# Patient Record
Sex: Male | Born: 1985 | Race: Black or African American | Hispanic: No | Marital: Single | Smoking: Current every day smoker
Health system: Southern US, Community
[De-identification: ages and names within clinical notes are randomized; demographics above are authoritative.]

## PROBLEM LIST (undated history)

## (undated) DIAGNOSIS — B20 Human immunodeficiency virus [HIV] disease: Secondary | ICD-10-CM

---

## 2013-03-02 ENCOUNTER — Emergency Department (HOSPITAL_COMMUNITY): Payer: No Typology Code available for payment source

## 2013-03-02 ENCOUNTER — Emergency Department (HOSPITAL_COMMUNITY)
Admission: EM | Admit: 2013-03-02 | Discharge: 2013-03-02 | Disposition: A | Discharge status: Home / Self Care | Payer: No Typology Code available for payment source | Attending: Emergency Medicine | Admitting: Emergency Medicine

## 2013-03-02 ENCOUNTER — Encounter (HOSPITAL_COMMUNITY): Payer: Self-pay | Admitting: *Deleted

## 2013-03-02 DIAGNOSIS — IMO0002 Reserved for concepts with insufficient information to code with codable children: Secondary | ICD-10-CM | POA: Insufficient documentation

## 2013-03-02 DIAGNOSIS — Y9389 Activity, other specified: Secondary | ICD-10-CM | POA: Insufficient documentation

## 2013-03-02 DIAGNOSIS — S50812A Abrasion of left forearm, initial encounter: Secondary | ICD-10-CM

## 2013-03-02 DIAGNOSIS — Z21 Asymptomatic human immunodeficiency virus [HIV] infection status: Secondary | ICD-10-CM | POA: Insufficient documentation

## 2013-03-02 DIAGNOSIS — Y92410 Unspecified street and highway as the place of occurrence of the external cause: Secondary | ICD-10-CM | POA: Insufficient documentation

## 2013-03-02 DIAGNOSIS — S60222A Contusion of left hand, initial encounter: Secondary | ICD-10-CM

## 2013-03-02 DIAGNOSIS — S60229A Contusion of unspecified hand, initial encounter: Secondary | ICD-10-CM | POA: Insufficient documentation

## 2013-03-02 HISTORY — DX: Human immunodeficiency virus (HIV) disease: B20

## 2013-03-02 MED ORDER — LIDOCAINE-EPINEPHRINE-TETRACAINE (LET) SOLUTION
3.0000 mL | Freq: Once | NASAL | Status: AC
  Administered 2013-03-02: 3 mL via TOPICAL
  Filled 2013-03-02 (×2): qty 3

## 2013-03-02 MED ORDER — CYCLOBENZAPRINE HCL 10 MG PO TABS: 10.0000 mg | ORAL_TABLET | Freq: Two times a day (BID) | ORAL | Status: AC | PRN

## 2013-03-02 MED ORDER — IBUPROFEN 800 MG PO TABS: 800.0000 mg | ORAL_TABLET | Freq: Three times a day (TID) | ORAL | Status: AC
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
## 2013-03-03 NOTE — ED Provider Notes (Signed)
Medical screening examination/treatment/procedure(s) were performed by non-physician practitioner and as supervising physician I was immediately available for consultation/collaboration.    Danarius Mcconathy R Marri Mcneff, MD 03/03/13 1509 

## 2013-04-06 ENCOUNTER — Ambulatory Visit: Payer: Self-pay
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
## 2018-03-12 ENCOUNTER — Emergency Department (HOSPITAL_BASED_OUTPATIENT_CLINIC_OR_DEPARTMENT_OTHER)
Admission: EM | Admit: 2018-03-12 | Discharge: 2018-03-12 | Disposition: A | Discharge status: Home / Self Care | Payer: Self-pay | Attending: Emergency Medicine | Admitting: Emergency Medicine

## 2018-03-12 ENCOUNTER — Other Ambulatory Visit: Payer: Self-pay

## 2018-03-12 ENCOUNTER — Encounter (HOSPITAL_BASED_OUTPATIENT_CLINIC_OR_DEPARTMENT_OTHER): Payer: Self-pay | Admitting: Emergency Medicine

## 2018-03-12 DIAGNOSIS — F17200 Nicotine dependence, unspecified, uncomplicated: Secondary | ICD-10-CM | POA: Insufficient documentation

## 2018-03-12 DIAGNOSIS — B20 Human immunodeficiency virus [HIV] disease: Secondary | ICD-10-CM | POA: Insufficient documentation

## 2018-03-12 DIAGNOSIS — B070 Plantar wart: Secondary | ICD-10-CM | POA: Insufficient documentation

## 2018-03-12 LAB — CBC WITH DIFFERENTIAL/PLATELET
BASOS PCT: 1 %
Basophils Absolute: 0.1 10*3/uL (ref 0.0–0.1)
EOS PCT: 7 %
Eosinophils Absolute: 0.4 10*3/uL (ref 0.0–0.7)
HEMATOCRIT: 41.8 % (ref 39.0–52.0)
HEMOGLOBIN: 14.6 g/dL (ref 13.0–17.0)
Lymphocytes Relative: 27 %
Lymphs Abs: 1.5 10*3/uL (ref 0.7–4.0)
MCH: 31.1 pg (ref 26.0–34.0)
MCHC: 34.9 g/dL (ref 30.0–36.0)
MCV: 89.1 fL (ref 78.0–100.0)
MONOS PCT: 11 %
Monocytes Absolute: 0.6 10*3/uL (ref 0.1–1.0)
NEUTROS PCT: 54 %
Neutro Abs: 2.9 10*3/uL (ref 1.7–7.7)
Platelets: 75 10*3/uL — ABNORMAL LOW (ref 150–400)
RBC: 4.69 MIL/uL (ref 4.22–5.81)
RDW: 13.5 % (ref 11.5–15.5)
SMEAR REVIEW: DECREASED
WBC: 5.5 10*3/uL (ref 4.0–10.5)

## 2018-03-12 LAB — BASIC METABOLIC PANEL
ANION GAP: 8 (ref 5–15)
BUN: 9 mg/dL (ref 6–20)
CHLORIDE: 107 mmol/L (ref 98–111)
CO2: 25 mmol/L (ref 22–32)
Calcium: 8.8 mg/dL — ABNORMAL LOW (ref 8.9–10.3)
Creatinine, Ser: 1.13 mg/dL (ref 0.61–1.24)
GFR calc Af Amer: >60 mL/min (ref >60)
GLUCOSE: 88 mg/dL (ref 70–99)
POTASSIUM: 4.3 mmol/L (ref 3.5–5.1)
Sodium: 140 mmol/L (ref 135–145)

## 2018-03-12 MED ORDER — LIDOCAINE-EPINEPHRINE (PF) 2 %-1:200000 IJ SOLN
10.0000 mL | Freq: Once | INTRAMUSCULAR | Status: AC
  Administered 2018-03-12: 10 mL
  Filled 2018-03-12 (×2): qty 10

## 2018-03-12 NOTE — Discharge Instructions (Addendum)
White count is normal today.  Given risk that we discussed, we did not remove the wart.  Unfortunately, the pain will persist until you are treated by podiatry.  You can call Dr. Logan BoresEvans office tomorrow and see if he can see you sooner for reevaluation.  I have sent him a message to attempt to facilitate close follow up.   Stop cutting or poking the wart as this puts you at risk for a superimposed infection.  Keep the area clean, wash with clean water and soap after sweating or if it gets dirty.  For pain can take 1000 mg of acetaminophen and/or 600 mg of ibuprofen every 6-8 hours.  Return to the ER for worsening swelling, warmth, pus, fevers, chills

## 2018-03-12 NOTE — ED Triage Notes (Signed)
Pt states he has a plantars wart on the bottom of his left foot  Pt states he has been seen at [facility] and [facility]  Pt states he has been trying to remove it at home but it is painful and has some pus

## 2018-03-12 NOTE — ED Provider Notes (Signed)
[facility] EMERGENCY DEPARTMENT Provider Note   CSN: [id number] Arrival date & time: 03/12/18  2042     History   Chief Complaint Chief Complaint  Patient presents with  . Foot Pain    HPI Douglas Mcconnell is a 32 y.o. male with history of HIV on Bactrim prophylaxis, compliant on HIV medications, is here for evaluation of worsening pain associated with plantar wart to the bottom of his left foot.  Onset 2 months ago.  Pain is severe, gradually worsening.  Patient states he has been to multiple ERs for this.  Associate symptoms include local swelling and bruising, clear/yellow discharge and foot swelling.  He has tried duct tape, over-the-counter freezing wart liquid.  His friend has been taking tweezers and has tried to take the wart out.  Pain is worse with any palpation, putting weight on it.  Has taken over-the-counter NSAIDs for pain without significant relief.  He has scheduled a [facility] appointment at [facility] in [facility] month.  He denies fevers, chills.  HPI  Past Medical History:  Diagnosis Date  . HIV (human immunodeficiency virus infection) (HCC)     There are no active problems to display for this patient.   History reviewed. No pertinent surgical history.      Home Medications    Prior to Admission medications   Medication Sig Start Date End Date Taking? Authorizing Provider  cyclobenzaprine (FLEXERIL) 10 MG tablet Take 1 tablet (10 mg total) by mouth 2 (two) times daily as needed for muscle spasms. 03/02/13   Kirichenko, Lemont Fillers, PA-C  ibuprofen (ADVIL,MOTRIN) 800 MG tablet Take 1 tablet (800 mg total) by mouth 3 (three) times daily. 03/02/13   Jaynie Crumble, PA-C    Family History No family history on file.  Social History Social History   Tobacco Use  . Smoking status: Current Every Day Smoker  . Smokeless tobacco: Never Used  Substance Use Topics  . Alcohol use: Yes  . Drug use: Yes    Types: Marijuana     Allergies   Patient has no known  allergies.   Review of Systems Review of Systems  Skin:       Plantar wart, drainage, swelling  Allergic/Immunologic: Positive for immunocompromised state.  All other systems reviewed and are negative.    Physical Exam Updated Vital Signs BP (!) 132/94 (BP Location: Left Arm)   Pulse 88   Temp 98.2 F (36.8 C) (Oral)   Resp 20   Ht 6\' 2"  (1.88 m)   Wt 79.4 kg   SpO2 96%   BMI 22.47 kg/m   Physical Exam  Constitutional: He is oriented to person, place, and time. He appears well-developed and well-nourished.  Non-toxic appearance.  HENT:  Head: Normocephalic.  Right Ear: External ear normal.  Left Ear: External ear normal.  Nose: Nose normal.  Eyes: Conjunctivae and EOM are normal.  Neck: Full passive range of motion without pain.  Cardiovascular: Normal rate.  Pulmonary/Chest: Effort normal. No tachypnea. No respiratory distress.  Musculoskeletal: Normal range of motion.  Neurological: He is alert and oriented to person, place, and time.  Skin: Skin is warm and dry. Capillary refill takes less than 2 seconds.  1 cm plantar wart to the bottom of the left foot mild skin hyperpigmentation circumferentially, tenderness and scant amount of serous-like/clear yellow fluid with pressure.  No fluctuance around this. No obvious foot edema, redness, warmth.  Psychiatric: His behavior is normal. Thought content normal.     ED Treatments /  Results  Labs (all labs ordered are listed, but only abnormal results are displayed) Labs Reviewed  CBC WITH DIFFERENTIAL/PLATELET - Abnormal; Notable for the following components:      Result Value   Platelets 75 (*)    All other components within normal limits  BASIC METABOLIC PANEL - Abnormal; Notable for the following components:   Calcium 8.8 (*)    All other components within normal limits    EKG None  Radiology No results found.  Procedures Procedures (including critical care time)  Medications Ordered in ED Medications    lidocaine-EPINEPHrine (XYLOCAINE W/EPI) 2 %-1:200000 (PF) injection 10 mL (10 mLs Infiltration Given 03/12/18 2242)     Initial Impression / Assessment and Plan / ED Course  I have reviewed the triage vital signs and the nursing notes.  Pertinent labs & imaging results that were available during my care of the patient were reviewed by me and considered in my medical decision making (see chart for details).  Clinical Course as of Aug 30 0001  Thu Mar 12, 2018  2233 Podiatry sept 23   [CG]    Clinical Course User Index [CG] Liberty HandyGibbons, Claudia J, PA-C   Given history of HIV on Bactrim prophylaxis, obtain screening labs.  WBC WNL.  He has no constitutional symptoms.  He has no obvious signs of superimposed infection such as cellulitis, abscess.  Considered removing/shaving the wart however I discussed with patient that this would put him at risk for superimposed infection.  Unfortunately, patient will continue to have pain and discomfort until he is followed up by podiatry.  I do not think there is indication for further emergent lab work or imaging or outpatient antibiotics.  Patient was provided with [facility] contact information in hopes to facilitate closer follow-up with podiatry.  I have messaged Dr. Gala LewandowskyBrent Evans to facilitate follow-up.  Discussed strict return precautions.  Patient is in agreement.  Final Clinical Impressions(s) / ED Diagnoses   Final diagnoses:  Plantar wart, left foot    ED Discharge Orders    None       Liberty HandyGibbons, Claudia J, PA-C 03/13/18 0001    Tilden Fossaees, Elizabeth, MD 03/13/18 [phone]

## 2018-03-12 NOTE — ED Notes (Signed)
Pt triaged by this provider

## 2023-05-06 NOTE — Telephone Encounter (Signed)
 The pt navigator made the second attempt to reach out to the pt to reschedule a missed appointment and was not able to reach them but was able to leave a generic voicemail message, PN send a msg thru MyChart, protocol completed.

## 2023-05-14 NOTE — Progress Notes (Signed)
 HIV Follow Up Visit  Subjective:    Douglas Mcconnell is a 37 y.o. male who presents to the Infectious Disease clinic for follow-up of HIV infection. Currently maintained on Triumeq and reports that he did stop his ARV about 2 months ago for constipation issues, states he did restart about a month ago.  Overall he states he is doing well and he voices no complaints.  Constipation has gotten better.  He is sexually active with men and engages in receptive and insertive anal sex as well as oral sex.   He is asymptomatic.   Review of Systems Pertinent items are noted in HPI.   HIV Diagnosis: 2005 ART History: Triumeq 2017-present Boosted [id number]   Resistance Testing:   Objective:  Ht 1.88 m (6' 2)   Wt 84.7 kg (186 lb 12.8 oz)   BMI 23.98 kg/m     Laboratory: HX %HELPER LYMPHOCYTES  Date Value Ref Range Status  04/23/2022 21.7 (L) 31.0 - 61.0 % Final   HIV-1 (RNA) Viral Load  Date Value Ref Range Status  01/17/2023 Detected (A) Not Detected Final   HIV-1 RNA, Quantitative PCR  Date Value Ref Range Status  01/17/2023 61 (H) <=0 copies/mL Final      Assessment:    HIV with stable course.    Plan:   1. HIV disease (CMD)  - Flu,Trivalent,IM, Preservative Free (FLULAVAL, FLUZONE (SCOT ONLY), FLUARIX (ALGNY ONLY)) - CBC with Differential - Comprehensive Metabolic Panel - Chlamydia / Gonococcus (GC), NAAT; Future - Chlamydia trachomatis and Neisseria gonorrhoeae (CT/NG) PCR (Rectal Swab); Future - Chlamydia trachomatis and Neisseria gonorrhoeae (CT/NG) PCR (Throat Swab); Future - Human Immunodeficiency Virus (HIV) 1 Quantitative Real-Time PCR (Plasma); Future - T-Lymphocyte Helper/Suppressor Profile; Future - Hepatitis Profile; Future - Hepatitis Profile - T-Lymphocyte Helper/Suppressor Profile - Human Immunodeficiency Virus (HIV) 1 Quantitative Real-Time PCR (Plasma) - Chlamydia trachomatis and Neisseria gonorrhoeae (CT/NG) PCR (Throat Swab) - Chlamydia  trachomatis and Neisseria gonorrhoeae (CT/NG) PCR (Rectal Swab) - Chlamydia / Gonococcus (GC), NAAT - Hepatitis A Virus (HAV) Antibody, IgM     Will continue treatment with current regimen. Medication adherence education provided by provider. See lab orders. Counseling provided on the prevention of transmission of HIV.   OI Prophylaxis  Vaccines  flu shot given today  -HepA repeat at return visit -HepB repeat at return visit.  Lipid screening  Dysplasia screening  discuss at future visit STI screening  check today  Latent TB screening  Screening lab studies for HIV related conditions  -DEXA -Urine protein Age-related health screening  -Colonoscopy -Mammogram -Lung cancer screen  I have personally spent 35 minutes involved in face-to-face and non-face-to-face activities for this patient on the day of the visit.  Professional time spent includes the following activities, in addition to those noted in the documentation: Preparing to see the patient (review of tests), Ordering medications/tests/procedures, referring and communicating with other health care professionals, and Communicating results to the patient/family/caregiver

## 2023-05-14 NOTE — Progress Notes (Signed)
 Patient was administered the influenza vaccine today. Patient was identified with two identifiers (full name and DOB). Vaccine information statement provided to patient. Administered vaccine, patient tolerated well. Refer to Immunizations record for complete administration information.  Patient was observed for 15 minutes after administration. Discharged to lobby. SABRAHeron Uvaldo Rayas, LPN

## 2023-07-04 NOTE — Telephone Encounter (Signed)
 Refill request received from patient for Triumeq to go to [facility]. Refill request granted per protocol.  Upcoming visit: 11/12/23 Recent visit: 05/14/23 Labs completed: 05/14/23  Message to R. Miller. FYI

## 2023-09-21 NOTE — ED Provider Notes (Signed)
 38 year old male presents the emergency room today with abdominal pain.  Around 5 PM today patient started getting generalized abdominal pain 10 out of 10 severity.  Patient has had about 4 episodes of vomiting since the pain started.  Patient says this has happened to him once before and it was caused by stress.  Patient does report he has been stressed out lately.  Patient says there is no blood in his vomit.  Patient's last BM yesterday and was normal.  Patient denies fever, headache, urinary complaints, chest pain, shortness of breath, diarrhea, bowel complaints.  Patient is hunched over in pain on exam, no localized tenderness, entire abdomen equally tender to the touch.  Patient defers anything for nausea at this time.  Patient says I just need something for to make this pain go away. IM morphine ordered.  Labs ordered in triage pending.  Patient seen and received a screening examination in triage.  Appropriate orders have been initiated based on my brief physical exam and HPI. Patient placed in the sub-waiting area until a treatment room comes available for further evaluation and management in the main ED.  Labs/imaging ordered to evaluate for a cause of this complaint. Vital signs reviewed, patient awake and alert in NAD.  Screening performed by Fleeta Givens PA-C   [facility]  [facility]  ED Provider Note  Douglas Mcconnell 38 y.o. male DOB: 01-11-1986 MRN: [id number] History   Chief Complaint  Patient presents with  . Abdominal Pain    Patient reports abdominal pain and emesis that began at 1700 today.    38 year old male presents the emergency room today with abdominal pain.  Around 5 PM today patient started getting generalized abdominal pain 10 out of 10 severity.  Patient says he was just walking back from inside when this occurred. patient has had about 4 episodes of vomiting since the pain started.  Patient says this has happened to him once before and it  was caused by stress.  Patient does report he has been stressed out lately.  Patient says there is no blood in his vomit.  Patient's last BM yesterday and was normal.  Patient denies fever, headache, urinary complaints, chest pain, shortness of breath, diarrhea, bowel complaints.  Patient is hunched over in pain on exam, no localized tenderness, entire abdomen equally tender to the touch.  Patient defers anything for nausea at this time.  Patient says I just need something for to make this pain go away. IM morphine ordered.  Labs ordered in triage pending.  Patient seen and received a screening examination in triage.  Appropriate orders have been initiated based on my brief physical exam and HPI. Patient placed in the sub-waiting area until a treatment room comes available for further evaluation and management in the main ED.  Labs/imaging ordered to evaluate for a cause of this complaint. Vital signs reviewed, patient awake and alert in NAD.  Screening performed by Fleeta Givens PA-C       Past Medical History:  Diagnosis Date  . HIV (human immunodeficiency virus infection)  (*)     History reviewed. No pertinent surgical history.  Social History   Substance and Sexual Activity  Alcohol Use Yes   Comment: occasional   Social History   Tobacco Use  Smoking Status Former  . Current packs/day: 0.00  . Average packs/day: 0.3 packs/day for 16.0 years (4.0 ttl pk-yrs)  . Types: Cigarettes  . Start date: 01/06/2007  . Quit date: 01/06/2023  .  Years since quitting: 0.7  . Passive exposure: Past  Smokeless Tobacco Never  Tobacco Comments   01/21/23 day 19 of not smoking   E-Cigarettes  . Vaping Use Never User   . Start Date    . Cartridges/Day    . Quit Date     Social History   Substance and Sexual Activity  Drug Use Yes  . Types: Marijuana, Cocaine   Tetanus up to date?: Unknown Immunizations Up to Date?: Unknown   No Known Allergies  Discharge Medication List as  of 09/22/2023  1:57 AM     CONTINUE these medications which have NOT CHANGED   Details  abacavir 300 MG TABS 2 tablet, dolutegravir 50 mg TABS 1 tablet, lamiVUDine 150 mg TABS 2 tablet Take by mouth daily., Historical Med    abacavir-dolutegravir-lamiVUDine (TRIUMEQ) 600-50-300 mg per tablet TAKE 1 TABLET BY MOUTH DAILY, Historical Med    clindamycin (CLEOCIN) 300 mg capsule Take one capsule (300 mg dose) by mouth 4 (four) times daily., Starting Tue 02/15/2020, Print    diclofenac sodium (VOLTAREN) 75 mg EC tablet Take one tablet (75 mg total) by mouth 2 (two) times daily., Starting Mon 11/11/2016, Until Thu 11/21/2016, Normal    ibuprofen  (ADVIL ,MOTRIN ) 600 mg tablet Take one tablet (600 mg dose) by mouth every 8 (eight) hours as needed for up to 7 days., Starting Fri 12/27/2022, Until Fri 01/03/2023 at 2359, Print    naproxen (NAPROSYN) 500 mg tablet Take one tablet (500 mg dose) by mouth 2 (two) times daily., Starting Tue 02/15/2020, Print    pantoprazole sodium (PROTONIX) 40 mg tablet Take one tablet (40 mg dose) by mouth daily for 14 days., Starting Sun 01/12/2023, Until Sun 01/26/2023, Normal    sulfamethoxazole-trimethoprim (BACTRIM) 400-80 mg per tablet Take 1 tablet by mouth 2 (two) times daily., Historical Med    valacyclovir (VALTREX) 1000 mg tablet Take one tablet (1,000 mg dose) by mouth daily., Historical Med        Primary Survey   Exposure     No visible trauma noted on back exam.      Review of Systems   Review of Systems  Gastrointestinal:  Positive for abdominal pain, nausea and vomiting.  All other systems reviewed and are negative.   Physical Exam   ED Triage Vitals [09/21/23 2133]  BP (!) 128/95  Heart Rate 69  Resp 22  SpO2 100 %  Temp 97.5 F (36.4 C)    Physical Exam  Nursing note and vitals reviewed. Constitutional: He appears well-developed and well-nourished. He appears to be in pain.  HENT:  Head: Normocephalic and atraumatic.  Right Ear:  Normal external ear.  Left Ear: Normal external ear.  Eyes: EOM are intact. Pupils are equal, round, and reactive to light.  Neck: Normal range of motion.  Cardiovascular: Normal rate and intact distal pulses.  Pulmonary/Chest: Respiratory effort normal.  Abdominal: Soft. There is severe generalized abdominal tenderness.  Musculoskeletal: Normal range of motion. No visible trauma noted on back exam.     Cervical back: Normal range of motion.   Neurological: He is alert and oriented to person, place, and time. Moves all extremities equally. Gait normal. He has normal speech.  Skin: Skin is warm. Skin is dry.  Psychiatric: He has a normal mood and affect.    ED Course   Lab results:   CBC AND DIFFERENTIAL - Abnormal      Result Value   WBC 6.8     RBC 5.06  HGB 15.3     HCT 43.2     MCV 85.4     MCH 30.2     MCHC 35.4     Plt Ct 197     RDW SD 41.1     MPV 10.3     NRBC% 0.0     Absolute NRBC Count 0.00     NEUTROPHIL % 79.3     LYMPHOCYTE % 13.7     MONOCYTE % 6.2     Eosinophil % 0.0     BASOPHIL % 0.4     IG% 0.4     ABSOLUTE NEUTROPHIL COUNT 5.38     ABSOLUTE LYMPHOCYTE COUNT 0.93 (*)    Absolute Monocyte Count 0.42     Absolute Eosinophil Count 0.00     Absolute Basophil Count 0.03     Absolute Immature Granulocyte Count 0.03    COMPREHENSIVE METABOLIC PANEL - Abnormal   Na 134 (*)    Potassium 3.8     Cl 97     CO2 20     AGAP 17 (*)    Glucose 119 (*)    BUN 10     Creatinine 1.09     Ca 9.9     ALK PHOS 82     T Bili 1.0     Total Protein 8.5     Alb 4.7     GLOBULIN 3.8     ALBUMIN/GLOBULIN RATIO 1.2     BUN/CREAT RATIO 9.2 (*)    ALT 12     AST 18     eGFR 90     Comment: Normal GFR (glomerular filtration rate) > 60 mL/min/1.73 meters squared, < 60 may include impaired kidney function. Calculation based on the Chronic Kidney Disease Epidemiology Collaboration (CK-EPI)equation refit without adjustment for race.  URINALYSIS W/MICRO REFLEX  CULTURE - SYMPTOMATIC - Abnormal   Urine Color Yellow     Urine Clarity Clear     Urine Specific Gravity >1.050 (*)    Urine pH 7.0     Urine Protein - Dipstick Negative     Urine Glucose Negative     Urine Ketones 100 (*)    Urine Bilirubin Negative     Urine Blood Negative     Urine Nitrite Negative     Urine Urobilinogen <2     Urine Leukocyte Esterase Negative     UA Microscopic No Micro     Narrative:    Does not meet criteria for reflex to Urine Culture.  LIPASE - Normal   Lipase 7    LIGHT BLUE TOP  LAVENDER TOP  MINT GREEN-TOP TUBE (PST GEL/LI HEP)  GOLD SST    Imaging:   CT ABDOMEN PELVIS W IV CONTRAST   Narrative:    CT abdomen and pelvis with contrast:  INDICATION: Abdominal pain  TECHNIQUE: Axial scans were performed through the abdomen and pelvis with intravenous contrast. Contrast-75 mL Isovue-370. Radiation dose reduction was utilized (automated exposure control, mA or kV adjustment based on patient size, or iterative image reconstruction).  COMPARISON: 09/15/2019  FINDINGS:   #  Lung bases: Normal. #  Liver: Normal enhancement. No focal lesions. #  Gallbladder: #  Spleen: Normal. #  Pancreas: Normal #  Adrenal glands: Normal. #  Kidneys: Normal. #  Retroperitoneum: No mass or adenopathy. #  Aorta: No significant abnormality. #  Bowel and mesentery: There is no small bowel obstruction.  #  There are multiple loops of nondilated fluid-filled thick-walled  small bowel which can be seen with small bowel enteritis.  #     The appendix is not definitively identified.  The colon is poorly evaluated due to the lack of oral contrast and under distention. There is colonic wall thickening of the transverse descending and sigmoid colon with mild pericolonic stranding consistent with a nonspecific colitis.   Pelvis: #  No mass or adenopathy. #  No inflammatory changes seen. #  Bladder: No significant abnormality. #    #  Bony structures: No  significant abnormality.    Impression:    IMPRESSION:    Small bowel enteritis without small bowel obstruction.  Wall thickening of the transverse descending and sigmoid colon with pericolonic stranding consistent with a nonspecific colitis.   Electronically Signed by: Marinda Fleming, MD on 09/22/2023 1:00 AM    ECG: ECG Results   None                                                               Pre-Sedation Procedures    Medical Decision Making History and physical exam were reviewed. Differential includes but is not limited to IBS, appendicitis, colitis, gastritis, abdominal infection, diverticulitis, pancreatitis, cholecystitis  Basic labs ordered showed no acute abnormalities. IV morphine was given and patient's pain significantly improved.  Patient still denies anything for nausea at this time. Discussed risk first benefits of CT exam.  Patient opted for CT today CT significant for enteritis and colitis. Will discharge with Bentyl and liquid diet until symptoms seem to improve. reCommend patient take Tylenol as needed. Patient is to be discharged pending a UA   Amount and/or Complexity of Data Reviewed Labs: ordered. Decision-making details documented in ED Course. Radiology: ordered. Decision-making details documented in ED Course.  Risk Prescription drug management.    In reviewing the patient's old records, I reviewed their prior controlled substances prescriptions, using the PDMP.    Provider Communication  Discharge Medication List as of 09/22/2023  1:57 AM     START taking these medications   Details  dicyclomine (BENTYL) 20 mg tablet Take one tablet (20 mg dose) by mouth 2 (two) times daily., Starting Mon 09/22/2023, Normal    ondansetron (ZOFRAN) 4 mg tablet Take one tablet (4 mg dose) by mouth every 8 (eight) hours as needed for Nausea for up to 7 days., Starting Mon 09/22/2023, Until Mon 09/29/2023 at 2359,  Normal        Discharge Medication List as of 09/22/2023  1:57 AM      Discharge Medication List as of 09/22/2023  1:57 AM      Clinical Impression Final diagnoses:  Colitis  Enteritis    ED Disposition     ED Disposition  Discharge   Condition  Stable   Comment  --                   Electronically signed by:    Lynwood JAYSON Life, MD 09/22/23 [phone]

## 2023-09-24 NOTE — Progress Notes (Signed)
 HIV Follow Up Visit  Subjective:    Douglas Mcconnell is a 38 y.o. male who presents to the Infectious Disease clinic for follow-up of HIV infection. He is currently maintained on Triumeq and reports no missed doses.  ER recently for abdominal pain and n/v, no diarrhea.   Found to have enteritis/colitis sent home on bentyl and zofran.  States he is using bentyl but not the zofran- states he does not need that as his nausea has improved and also the   States his appetite has not improved -has been trying to use the clear liquid diet, gatorade did not sit well on his stomach. No fever, chills or nightsweats.     Review of Systems Pertinent items are noted in HPI.   HIV Diagnosis: 2005 ART History: Triumeq 2017-present Boosted [id number]   Resistance Testing:   Objective:  BP 117/81   Pulse (!) 48   Temp 98 F (36.7 C) (Temporal)   Resp 18   Wt 78.7 kg (173 lb 9.6 oz)   SpO2 99%   BMI 22.29 kg/m     Laboratory: [facility] Lymph[facility]  Date Value Ref Range Status  05/14/2023 323 310 - 3,110 /uL Final   %Helper Lymphocytes  Date Value Ref Range Status  05/14/2023 20.8 (L) 31.0 - 61.0 % Final   HIV-1 (RNA) Viral Load  Date Value Ref Range Status  05/14/2023 Detected (A) Not Detected Final   HIV-1 RNA, Quantitative PCR  Date Value Ref Range Status  05/14/2023 34 (H) <=0 copies/mL Final      Assessment:    HIV with stable course.    Plan:   1. HIV disease (CMD) (Primary)  - CBC with Differential - Comprehensive Metabolic Panel - Chlamydia trachomatis and Neisseria gonorrhoeae (CT/NG) PCR (Rectal Swab); Future - Chlamydia / Gonococcus (GC), NAAT; Future - Human Immunodeficiency Virus (HIV) 1 Quantitative Real-Time PCR (Plasma); Future - Rapid Plasma Reagin (RPR), Qualitative Test with Reflex to Titer and Confirmation - Human Immunodeficiency Virus (HIV) 1 Quantitative Real-Time PCR (Plasma)  Will continue treatment with current regimen. Medication adherence  education provided by provider. See lab orders. Counseling provided on the prevention of transmission of HIV.  2. Research study patient  - Hemoglobin A1C With Estimated Average Glucose       3.  Enteritis/Colitis - Chlamydia trachomatis and Neisseria gonorrhoeae (CT/NG) PCR (Rectal Swab) - Chlamydia / Gonococcus (GC), NAAT - Rapid Plasma Reagin (RPR), Qualitative Test with Reflex to Titer and Confirmation; Future Continue on medications provided by ER.  Clear liquid diet for the next few days.  Call back if symptoms not improved in a few days    OI Prophylaxis  Vaccines  flu shot given today  -HepA repeat at return visit -HepB repeat at return visit.  Lipid screening  Dysplasia screening  discuss at future visit STI screening  check today  Latent TB screening  Screening lab studies for HIV related conditions  -DEXA -Urine protein Age-related health screening  -Colonoscopy -Mammogram -Lung cancer screen  I have personally spent 30 minutes involved in face-to-face and non-face-to-face activities for this patient on the day of the visit.  Professional time spent includes the following activities, in addition to those noted in the documentation: Preparing to see the patient (review of tests), Ordering medications/tests/procedures, referring and communicating with other health care professionals, and Communicating results to the patient/family/caregiver

## 2023-09-24 NOTE — ED Notes (Signed)
 Pt's family member requesting to speak to charge RN. This RN went to lobby and pt family member had concerns about how people are being seen in the ED, but his family member is not. Pt family member informed that we are seeing people in hallway beds and MSE that may be appropriate for those care areas, but given chief complaint of pt, neither of those areas would be suitable. Informed pt family member that we are doing our best to get people seen in a timely manner. Family member understood. Pt sitting in lobby in wheelchair in NAD.  Landscape architect

## 2023-09-24 NOTE — ED Provider Notes (Signed)
 Medical screening initiated and orders placed by Ozell KATHEE Birmingham, PA-C. 09/24/2023 / 8:57 PM  38 y.o. male presents with lower abdominal pain.  Feels like a squeezing and tightening.  Worse in the lower quadrants.  Continues with nausea and diarrhea.  States he was seen in the ED recently diagnosed with colitis.  Has been taking dicyclomine without relief.  Has had chills but does not know of any fevers.  Patient looks uncomfortable, hunched over in pain.  Initial vitals are stable.  He has lower abdominal tenderness on brief abdominal exam, no guarding.  Will check labs.  CT abdomen pelvis on 09/22/2023 with small bowel enteritis without small bowel obstruction.  Findings for colitis.   Patient seen and received a screening examination in triage.  Appropriate orders have been initiated based on my brief physical exam and HPI. Patient placed in the sub-waiting area until a treatment room comes available for further evaluation and management in the main ED.  This medical screening exam was electronically signed by Ozell KATHEE Birmingham, PA-C on 09/24/2023 at 8:57 PM     [facility]  ED Provider Note  Nashawn Denham Mose 38 y.o. male DOB: Sep 17, 1985 MRN: [id number] History   Chief Complaint  Patient presents with  . Abdominal Pain    Dx with colitis last week. Pain worse,    HPI 38 y.o. male presents with lower abdominal pain.  Feels like a squeezing and tightening.  Worse in the lower quadrants.  Continues with nausea and diarrhea.  States he was seen in the ED recently diagnosed with colitis.  Has been taking dicyclomine without relief.  Has had chills but does not know of any fevers.    Past Medical History:  Diagnosis Date  . HIV (human immunodeficiency virus infection)  (*)     History reviewed. No pertinent surgical history.  Social History   Substance and Sexual Activity  Alcohol Use Yes   Comment: occasional   Social History   Tobacco Use   Smoking Status Former  . Current packs/day: 0.00  . Average packs/day: 0.3 packs/day for 16.0 years (4.0 ttl pk-yrs)  . Types: Cigarettes  . Start date: 01/06/2007  . Quit date: 01/06/2023  . Years since quitting: 0.7  . Passive exposure: Past  Smokeless Tobacco Never  Tobacco Comments   01/21/23 day 19 of not smoking   E-Cigarettes  . Vaping Use Never User   . Start Date    . Cartridges/Day    . Quit Date     Social History   Substance and Sexual Activity  Drug Use Yes  . Types: Marijuana, Cocaine         No Known Allergies  Home Medications   ABACAVIR 300 MG TABS 2 TABLET, DOLUTEGRAVIR 50 MG TABS 1 TABLET, LAMIVUDINE 150 MG TABS 2 TABLET    Take by mouth daily.   ABACAVIR-DOLUTEGRAVIR-LAMIVUDINE (TRIUMEQ) 600-50-300 MG PER TABLET    TAKE 1 TABLET BY MOUTH DAILY   CLINDAMYCIN (CLEOCIN) 300 MG CAPSULE    Take one capsule (300 mg dose) by mouth 4 (four) times daily.   DICLOFENAC SODIUM (VOLTAREN) 75 MG EC TABLET    Take one tablet (75 mg total) by mouth 2 (two) times daily.   DICYCLOMINE (BENTYL) 20 MG TABLET    Take one tablet (20 mg dose) by mouth 2 (two) times daily.   IBUPROFEN  (ADVIL ,MOTRIN ) 600 MG TABLET    Take one tablet (600 mg dose) by mouth every 8 (eight)  hours as needed for up to 7 days.   NAPROXEN (NAPROSYN) 500 MG TABLET    Take one tablet (500 mg dose) by mouth 2 (two) times daily.   ONDANSETRON (ZOFRAN) 4 MG TABLET    Take one tablet (4 mg dose) by mouth every 8 (eight) hours as needed for Nausea for up to 7 days.   PANTOPRAZOLE SODIUM (PROTONIX) 40 MG TABLET    Take one tablet (40 mg dose) by mouth daily for 14 days.   SULFAMETHOXAZOLE-TRIMETHOPRIM (BACTRIM) 400-80 MG PER TABLET    Take 1 tablet by mouth 2 (two) times daily.   VALACYCLOVIR (VALTREX) 1000 MG TABLET    Take one tablet (1,000 mg dose) by mouth daily.    Primary Survey   Exposure    No visible abdominal trauma.       Review of Systems   Review of Systems  Constitutional:  Positive  for appetite change and fatigue. Negative for chills and fever.  HENT:  Negative for ear pain and sore throat.   Eyes:  Negative for pain and visual disturbance.  Respiratory:  Negative for cough and shortness of breath.   Cardiovascular:  Negative for chest pain and palpitations.  Gastrointestinal:  Positive for abdominal pain, nausea and vomiting.  Genitourinary:  Negative for dysuria and hematuria.  Musculoskeletal:  Negative for arthralgias and back pain.  Skin:  Negative for color change and rash.  Neurological:  Negative for seizures and syncope.  All other systems reviewed and are negative.   Physical Exam   ED Triage Vitals  BP 09/24/23 1747 128/85  Heart Rate 09/24/23 1745 60  Resp 09/24/23 1745 23  SpO2 09/24/23 1745 99 %  Temp 09/24/23 1745 98.3 F (36.8 C)    Physical Exam  Nursing note and vitals reviewed. Constitutional: He appears well-developed and well-nourished. He appears to be in pain, does not appear distressed, does not appear ill and no respiratory distress. Not diaphoretic. Uncomfortable appearing, non toxic, no acute distress, no respiratory distress   HENT:  Head: Normocephalic and atraumatic.  Right Ear: Normal external ear. Normal ear canal.  Left Ear: Normal external ear. Normal ear canal.  Nose: Nose normal.  Mouth/Throat: Mucous membranes are dry. Voice normal.  Eyes: Pupils are equal, round, and reactive to light. Right eye: no conjunctival injection. Left eye: no conjunctival injection.  Neck: Normal range of motion and voice normal. Normal range of motion.  Cardiovascular: Normal rate, regular rhythm, normal heart sounds and intact distal pulses.  No audible murmur.  Pulmonary/Chest: No respiratory distress. Not tachypneic. Respiratory effort normal and breath sounds normal.  Abdominal: Soft. There is abdominal tenderness. Abdomen not distended. No visible abdominal trauma.  Musculoskeletal: Normal range of motion. No obvious deformity noted  to extremities.     Cervical back: Normal range of motion. Normal range of motion.   Neurological: He is alert and oriented to person, place, and time.  Skin: Skin is warm. Not diaphoretic. Skin is dry.  Psychiatric: He has a normal mood and affect. His behavior is normal.    ED Course   Lab results:   CBC AND DIFFERENTIAL - Abnormal      Result Value   WBC 7.4     RBC 5.23     HGB 15.9     HCT 45.0     MCV 86.0     MCH 30.4     MCHC 35.3     Plt Ct 199     RDW SD  40.8     MPV 9.6     NRBC% 0.0     Absolute NRBC Count 0.00     NEUTROPHIL % 67.9     LYMPHOCYTE % 17.2     MONOCYTE % 13.4     Eosinophil % 0.5     BASOPHIL % 0.7     IG% 0.3     ABSOLUTE NEUTROPHIL COUNT 5.01     ABSOLUTE LYMPHOCYTE COUNT 1.27     Absolute Monocyte Count 0.99 (*)    Absolute Eosinophil Count 0.04     Absolute Basophil Count 0.05     Absolute Immature Granulocyte Count 0.02    COMPREHENSIVE METABOLIC PANEL - Abnormal   Na 132 (*)    Potassium 4.0     Cl 94 (*)    CO2 26     AGAP 12     Glucose 90     BUN 17     Creatinine 1.32 (*)    Ca 9.2     ALK PHOS 78     T Bili 0.8     Total Protein 8.5     Alb 4.7     GLOBULIN 3.8     ALBUMIN/GLOBULIN RATIO 1.2     BUN/CREAT RATIO 12.9     ALT 11     AST 15     eGFR 71     Comment: Normal GFR (glomerular filtration rate) > 60 mL/min/1.73 meters squared, < 60 may include impaired kidney function. Calculation based on the Chronic Kidney Disease Epidemiology Collaboration (CK-EPI)equation refit without adjustment for race.  URINALYSIS ONLY - NO SYMPTOMS - Abnormal   Urine Color Yellow     Urine Clarity Clear     Urine Specific Gravity >1.050 (*)    Urine pH 5.5     Urine Protein - Dipstick 10 (*)    Urine Glucose Negative     Urine Ketones 20 (*)    Urine Bilirubin Negative     Urine Blood Negative     Urine Nitrite Negative     Urine Urobilinogen <2     Urine Leukocyte Esterase Negative     UA Microscopic No Micro    LIPASE -  Normal   Lipase 9    MAGNESIUM - Normal   Mg 2.5    LIGHT BLUE TOP  LAVENDER TOP  MINT GREEN-TOP TUBE (PST GEL/LI HEP)  GOLD SST    Imaging:   CT ABDOMEN PELVIS W IV CONTRAST   Narrative:    COMPARISON: 09/22/2023   INDICATION: Abdominal Pain HIV positive  TECHNIQUE:  CT ABDOMEN PELVIS W IV CONTRAST   75 mL  IOPAMIDOL 76 % IV SOLN Dose reduction was utilized (automated exposure control, mA or kV adjustment based on patient size, or iterative image reconstruction).  FINDINGS:  CT ABDOMEN PELVIS   Lower Chest: Unremarkable  Liver: Unremarkable  Gallbladder and Biliary Tree: Unremarkable  Pancreas: Unremarkable  Spleen: Unremarkable  Adrenal Glands: Unremarkable  Kidneys and Urinary Bladder: Unremarkable kidneys.  Mild diffuse urinary bladder wall thickening.  Reproductive System: Unremarkable  Vasculature: Unremarkable  Lymph Nodes: Unremarkable  Peritoneum and Retroperitoneum: No free fluid. No pneumoperitoneum.  No abscess.  Gastrointestinal Tract: Small hiatal hernia.  Mild diffuse gastric wall thickening.  Multiple mildly distended fluid-filled loops of small bowel with air-fluid levels.  No bowel obstruction.  No evidence for acute appendicitis.  Mild diffuse colonic bowel wall thickening.  Abdominal and Pelvic Walls: Unremarkable  Bones: Unremarkable.  Impression:    IMPRESSION: CT ABDOMEN PELVIS  1.  Suspected gastroenteritis. 2.  Negative for bowel obstruction. 3.  No evidence for acute appendicitis. 4.  Mild diffuse urinary bladder wall thickening, which may represent a cystitis.    Electronically Signed by: Charlie Patch, MD on 09/24/2023 11:38 PM    ECG: ECG Results   None                                                               Pre-Sedation Procedures    Medical Decision Making I reviewed diagnosis with the patient. All questions answered. Patient is comfortable with the plan to go home and  follow up as an outpatient.  Patient remains hemodynamically stable and ready for discharge.  Strict return precautions were discussed and given in writing.   Risk Prescription drug management.         Provider Communication  New Prescriptions   CIPROFLOXACIN (CIPRO) 500 MG TABLET    Take one tablet (500 mg dose) by mouth every 12 (twelve) hours for 7 days.      Quantity: 14 tablet    Refills: 0   METRONIDAZOLE (FLAGYL) 500 MG TABLET    Take one tablet (500 mg dose) by mouth 2 (two) times daily for 7 days.      Quantity: 14 tablet    Refills: 0    Modified Medications   No medications on file    Discontinued Medications   No medications on file    Clinical Impression Final diagnoses:  Abdominal pain, unspecified abdominal location  Gastroenteritis    ED Disposition     ED Disposition  Discharge   Condition  Stable   Comment  --                 Follow-up Information     Vernell JAYSON Pinal, PA-C.   Specialty: Physician Assistant Contact information: [facility] [address] [place] [address] [phone]                  Electronically signed by:    Waddell PARAS Day, DO 09/25/23 0206

## 2023-09-24 NOTE — Progress Notes (Signed)
 Urine and specimen collected and sent to lab.

## 2023-09-25 NOTE — ED Notes (Signed)
Pt provided with a urinal for urine specimen.  

## 2023-09-30 NOTE — Telephone Encounter (Signed)
 Patient brought disability paperwork to the office for provider Vernell Pinal to complete. She is not in the office this week. Advised patient that it could take up to 10 business days for her to complete them since she is not in, and I am unsure if the covering provider will want to complete them.   PN will place paperwork in provider's box. I have also e-mailed the papers to the provider and Damien since I too will be out of the office the rest of the week.

## 2023-10-08 NOTE — Progress Notes (Signed)
 Patient ID:  Douglas Mcconnell is a 38 y.o. (DOB 12-20-1985) male.   Subjective      Patient presents with  . New Patient    Santina to [facility] 09/21/2023-0312/2025 for stomach pain states he is doing better they told him he has Colitis, wants to talk about what he can do for it and what to eat and not to eat and everything like that      History of Present Illness The patient is a 38 year old male with a history of HIV who came to the clinic today to establish care and talk about his recent hospital visits for colitis and enteritis.  He says he isn't having any stomach pain right now. He finished his antibiotics and nausea medicine as prescribed. He has changed a lot of what he eats, which he thinks has helped him stay symptom-free. He remembers having a similar issue in 04-29-2020, but it wasn't as bad. He hasn't seen a gastroenterologist before. He thinks his recent symptoms might have been caused by something he ate the night before he went to the hospital. His symptoms started suddenly between 5 and 6 PM the next day, including nausea and vomiting, but no diarrhea. It got so bad that he couldn't even keep water down. He went to the emergency room and was first given Bentyl and Zofran, but these made things worse. On 09/24/2023, he was given Cipro and Flagyl. He didn't notice any strange tastes in his food. He isn't a heavy drinker but likes to drink occasionally. He has had some financial difficulties with food expenses since his colitis diagnosis, which has required him to change his diet. He has been on short-term disability leave from work because of his illness and is looking forward to going back. He hasn't had any major financial problems over the past year. He hasn't set up primary care in  [facility].  He has a history of HIV and is currently under the care of [facility]. He is taking Triumeq for his HIV.  SOCIAL HISTORY - Former smoker, quit 9 months ago - Occasionally drinks alcohol, does not  consider himself a heavy drinker  FAMILY HISTORY - Mother had stage IVa mouth and throat cancer, passed away in 04-29-18 - Family history of cancers on mother's side (unsure of types) - Grandfather had lung cancer attributed to smoking and diabetes     Reviewed and updated this visit by provider: Tobacco  Allergies  Meds  Problems  Med Hx  Surg Hx  Fam Hx        Review of Systems  Constitutional: Negative.   HENT: Negative.    Respiratory: Negative.    Cardiovascular: Negative.   Gastrointestinal: Negative.   All other systems reviewed and are negative.   Negative except as noted in History of Present Illness.  Objective   BP 117/77 (BP Location: Right Upper Arm, Patient Position: Sitting)   Pulse 83   Temp 97.3 F (36.3 C) (Temporal)   Ht 6' 0.28 (1.836 m)   Wt 180 lb 6.4 oz (81.8 kg)   SpO2 95%   BMI 24.28 kg/m   Physical Exam Vitals and nursing note reviewed.  Constitutional:      General: He is not in acute distress.    Appearance: Normal appearance. He is not ill-appearing.  HENT:     Head: Normocephalic and atraumatic.  Eyes:     Extraocular Movements: Extraocular movements intact.     Conjunctiva/sclera: Conjunctivae normal.     Pupils:  Pupils are equal, round, and reactive to light.  Cardiovascular:     Rate and Rhythm: Normal rate and regular rhythm.     Pulses: Normal pulses.     Heart sounds: Normal heart sounds. No murmur heard.    No friction rub. No gallop.  Musculoskeletal:        General: Normal range of motion.     Cervical back: Normal range of motion.  Pulmonary:     Effort: Pulmonary effort is normal. No respiratory distress.     Breath sounds: Normal breath sounds. No wheezing.  Abdominal:     General: Abdomen is flat.     Palpations: Abdomen is soft.     Tenderness: There is no abdominal tenderness.  Neurological:     General: No focal deficit present.     Mental Status: He is alert and oriented to person, place, and time.  Mental status is at baseline.     Motor: No weakness.  Psychiatric:        Mood and Affect: Mood normal.        Behavior: Behavior normal.        Thought Content: Thought content normal.       Assessment/Plan  1. Colitis (Primary) -     Ambulatory referral to Gastroenterology 2. Asymptomatic HIV infection, with no history of HIV-related illness  (*) 3. Encounter to establish care    Assessment & Plan Colitis - Significant improvement since last visit - No current complaints of abdominal pain, diarrhea, nausea, fatigue, or fever - Discussed possibility of autoimmune etiology - Referral to Gastroenterology for further evaluation - Advised to avoids high in FODMAP - Work note will be provided upon request - Seek immediate medical attention if symptoms recur to prevent further hospitalization  HIV - Currently on Triumeq - Continues follow-up with [facility] for HIV management - No changes to current HIV treatment plan necessary  Follow-up - Follow up in 6 weeks for annual physical examination and general laboratory tests     Risks, benefits, and alternatives of the medications and treatment plan prescribed today were discussed, and patient expressed understanding. Follow up in about 6 weeks (around 11/19/2023) for Annual Physical, Labs.   Orders Placed This Encounter  Procedures  . Ambulatory referral to Gastroenterology   Patient's Medications  New Prescriptions   No medications on file  Previous Medications   ABACAVIR-DOLUTEGRAVIR-LAMIVUDINE (TRIUMEQ) 600-50-300 MG PER TABLET    TAKE 1 TABLET BY MOUTH DAILY   VALACYCLOVIR (VALTREX) 1000 MG TABLET    Take one tablet (1,000 mg dose) by mouth daily.  Modified Medications   No medications on file  Discontinued Medications   ABACAVIR 300 MG TABS 2 TABLET, DOLUTEGRAVIR 50 MG TABS 1 TABLET, LAMIVUDINE 150 MG TABS 2 TABLET    Take by mouth daily.   CLINDAMYCIN (CLEOCIN) 300 MG CAPSULE    Take one capsule (300 mg dose) by mouth 4  (four) times daily.   DICLOFENAC SODIUM (VOLTAREN) 75 MG EC TABLET    Take one tablet (75 mg total) by mouth 2 (two) times daily.   DICYCLOMINE (BENTYL) 20 MG TABLET    Take one tablet (20 mg dose) by mouth 2 (two) times daily.   IBUPROFEN  (ADVIL ,MOTRIN ) 600 MG TABLET    Take one tablet (600 mg dose) by mouth every 8 (eight) hours as needed for up to 7 days.   NAPROXEN (NAPROSYN) 500 MG TABLET    Take one tablet (500 mg dose) by mouth 2 (two)  times daily.   PANTOPRAZOLE SODIUM (PROTONIX) 40 MG TABLET    Take one tablet (40 mg dose) by mouth daily for 14 days.   SULFAMETHOXAZOLE-TRIMETHOPRIM (BACTRIM) 400-80 MG PER TABLET    Take 1 tablet by mouth 2 (two) times daily.     Computer technology was used to create visit note. Consent from the patient/care giver was obtained prior to its use.

## 2023-11-25 NOTE — Progress Notes (Signed)
 Patient ID:  Douglas Mcconnell is a 38 y.o. (DOB Dec 21, 1985) male.    Subjective      Patient presents with  . Annual Exam    Pt is not fasting     History of Present Illness The patient, a 38 year old male, presents for an annual physical examination. He has a past medical history of recent colitis infection and asymptomatic HIV, for which he is under the care of [facility] and currently managed with Triumeq, with no reported concerns.  The patient reports a euthymic state since his last emergency room visit. He denies experiencing any abdominal pain or discomfort. He has resumed alcohol consumption in moderation without adverse effects and has returned to work. He is adhering to dietary modifications, avoiding fried, fast, and greasy foods. He does not engage in physical exercise beyond his occupational activities. He maintains good oral hygiene, brushing his teeth twice daily, and is actively seeking a dentist who accepts his insurance. He does not require corrective eyewear. 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  During his emergency room visit, he was diagnosed with colitis but has remained asymptomatic since then. He has tested his tolerance to fast food and alcohol, both of which he can consume without experiencing symptoms. He has not reported any episodes of gastroesophageal reflux disease (GERD).  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  The patient has recently acquired a roommate, which he anticipates will alleviate his previous concerns regarding food security. He does not express a need for social work referrals at this time.  SOCIAL HISTORY - Admits to drinking alcohol but not as much as he used to - Currently in the final semester of his second year of a criminal justice degree program  Patient Active Problem List   Diagnosis Date  Noted  . Bradycardia 09/18/2022  . HIV (human immunodeficiency virus infection) (*) 09/18/2022   No Known Allergies  Past Medical History:  Diagnosis Date  . HIV (human immunodeficiency virus infection) (*)    Menstrual History:       History reviewed. No pertinent surgical history. Social History   Socioeconomic History  . Marital status: Single  Tobacco Use  . Smoking status: Former    Current packs/day: 0.00    Average packs/day: 0.3 packs/day for 21.4 years (5.6 ttl pk-yrs)    Types: Cigarettes    Start date: 01/06/2007    Quit date: 01/06/2023    Years since quitting: 0.8    Passive exposure: Past  . Smokeless tobacco: Never  . Tobacco comments:    01/21/23 day 19 of not smoking  Vaping Use  . Vaping status: Never Used  Substance and Sexual Activity  . Alcohol use: Not Currently    Comment: occasional  . Drug use: Yes    Types: Cocaine, Marijuana  . Sexual activity: Yes    Partners: Male    Birth control/protection: Condom   Family History  Problem Relation Age of Onset  . Cancer Mother        Stage 4a Mouth And Throat Cancer   Immunization History  Administered Date(s) Administered  . Pfizer-BioNTech COVID-19 Monovalent Vaccine mRNA 01/22/2020, 02/12/2020   Health Maintenance  Topic Date Due  . Zoster Vaccine (1 of 2) Never done  . Hepatitis A Vaccine (2 of 2 - Risk 2-dose series) 07/06/2005  . Adult Wellness Exam  Never  done  . COVID-19 Vaccine (3 - Pfizer risk series) 03/11/2020  . Meningococcal Conjugate Vaccine (3 - Risk 2-dose series) 04/15/2022  . DTaP/Tdap/Td Vaccines (4 - Td or Tdap) 09/25/2026  . Pneumococcal Vaccine: Pediatrics and At Risk Patients (4 of 4 - PCV20 or PCV21) 10/05/2035  . Meningococcal B Vaccine  Aged Out  . HPV Vaccine  Aged Out    Review of Systems  All other systems reviewed and are negative.    Objective   BP 115/78 (BP Location: Right Upper Arm, Patient Position: Sitting)   Pulse 86   Temp 98.6 F (37 C) (Oral)    Ht 5' 11.93 (1.827 m)   Wt 175 lb 9.6 oz (79.7 kg)   BMI 23.86 kg/m    Physical Exam Vitals and nursing note reviewed.  Constitutional:      General: He is not in acute distress.    Appearance: Normal appearance. He is not ill-appearing.  HENT:     Head: Normocephalic and atraumatic.     Right Ear: Tympanic membrane, ear canal and external ear normal.     Left Ear: Tympanic membrane, ear canal and external ear normal.     Nose: Nose normal.     Mouth/Throat:     Mouth: Mucous membranes are moist.     Pharynx: Oropharynx is clear. No oropharyngeal exudate or posterior oropharyngeal erythema.  Eyes:     Extraocular Movements: Extraocular movements intact.     Conjunctiva/sclera: Conjunctivae normal.     Pupils: Pupils are equal, round, and reactive to light.  Neck:     Vascular: No carotid bruit.  Cardiovascular:     Rate and Rhythm: Normal rate and regular rhythm.     Pulses: Normal pulses.     Heart sounds: Normal heart sounds. No murmur heard.    No friction rub. No gallop.  Musculoskeletal:        General: No swelling, tenderness or signs of injury. Normal range of motion.     Cervical back: Normal range of motion and neck supple. No rigidity or tenderness.  Pulmonary:     Effort: Pulmonary effort is normal.     Breath sounds: Normal breath sounds. No stridor. No rhonchi or rales.  Abdominal:     General: Bowel sounds are normal. There is no distension.     Palpations: Abdomen is soft.     Tenderness: There is no abdominal tenderness. There is no guarding or rebound.     Hernia: No hernia is present.  Skin:    General: Skin is warm.     Capillary Refill: Capillary refill takes less than 2 seconds.     Findings: No erythema or rash.  Neurological:     General: No focal deficit present.     Mental Status: He is alert and oriented to person, place, and time.     Motor: No weakness.     Deep Tendon Reflexes: Reflexes normal.  Psychiatric:        Mood and Affect:  Mood normal.        Behavior: Behavior normal.        Thought Content: Thought content normal.        Judgment: Judgment normal.      Physical Exam - Eyes: Pupils equal and round, conjunctivae clear - Nose: Septum midline, nares patent, mucosa normal - Mouth/Throat: Mucous membranes moist, no erythema, no exudate - Respiratory: Clear to auscultation, no wheezing, rales or rhonchi - Cardiovascular: Regular rate and rhythm, no  murmurs, rubs, or gallops - Gastrointestinal: Soft, no tenderness, no distention, no masses - Extremities: No edema, no cyanosis - Musculoskeletal: No joint or muscular abnormalities noted  Results     Assessment   1. Annual physical exam   2. Asymptomatic HIV infection, with no history of HIV-related illness (*)     Plan   Assessment & Plan Annual physical examination - Physical examination yielded normal results - Referral to a dentist will be initiated - Advised to schedule a lab visit at his earliest convenience next week - Immunizations reviewed; advised to consult with infectious disease team regarding additional vaccines  Colitis - Reports no current symptoms of colitis, including stomach pain or acid reflux - May have been an infectious colitis rather than an autoimmune condition - Advised to monitor for any recurrence of symptoms and follow up if they occur - Tested tolerance to fast food and alcohol without issues  Asymptomatic HIV - Continues to follow up with [facility] for HIV management - Reports no issues with current treatment - Currently on Triumeq - Advised to ensure regular screenings and check levels during infectious disease visits  Food insecurity - Recently acquired a roommate, which he anticipates will alleviate previous concerns about food security - Does not express a need for social work referrals at this time - Encouraged to reach out to the care connection team if encountering issues related to food  insecurity or financial difficulties - Monitoring situation with new roommate before seeking additional assistance  Follow-up - The patient will follow up in 1 year  No orders of the defined types were placed in this encounter. Patient's Medications  New Prescriptions   No medications on file  Previous Medications   ABACAVIR-DOLUTEGRAVIR-LAMIVUDINE (TRIUMEQ) 600-50-300 MG PER TABLET    TAKE 1 TABLET BY MOUTH DAILY   VALACYCLOVIR (VALTREX) 1000 MG TABLET    Take one tablet (1,000 mg dose) by mouth daily.  Modified Medications   No medications on file  Discontinued Medications   No medications on file     Age appropriate anticipatory guidance discussed Health maintenance issues including appropriate cancer screening, healthy diet, exercise and tobacco avoidance were discussed with the patient.  Risks, benefits, and alternatives of the medications and treatment plan prescribed today were discussed, and patient expressed understanding.  No follow-ups on file.  Computer technology was used to create visit note. Consent from the patient/care giver was obtained prior to its use.     SDoH Screening Completed SDoH screening was reviewed as documented with appropriate diagnoses added based on screening.  Time spent performing SDoH screening, documenting as appropriate, and entering intervention when necessary: less than 5 minutes
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
## 2024-02-26 NOTE — Telephone Encounter (Signed)
 Refill request received from [facility] in [place] for Triumeq.  Upcoming visit: 03/31/24 Recent visit: 09/24/23 Recent labs: 09/24/23  Reason for refill approval: protocol requirements met.  Message to R. Cleotilde as RICK.

## 2024-02-27 NOTE — Telephone Encounter (Signed)
 Pt called SW, requested him to fax records from ED visits 09/21/23 & 09/24/23 to [facility]. Pt used up PTO & needs to document that he had medical issues during that period. Pt was seen in [place] ED. SW advised pt that we cannot release documents from outside providers.Pt needs to contact [facility] & ask them to send his records to [place]. Pt will contact [facility].  John R Haworth Jr, MSW

## 2024-03-25 NOTE — Telephone Encounter (Signed)
 Refill request received from [facility] for Valtrex. Refill request sent to provider for determination.  Upcoming visit: 03/31/24 Recent visit: 09/24/23   Message to R. Cleotilde

## 2024-04-14 ENCOUNTER — Emergency Department (HOSPITAL_COMMUNITY)

## 2024-04-14 ENCOUNTER — Emergency Department (HOSPITAL_COMMUNITY)
Admission: EM | Admit: 2024-04-14 | Discharge: 2024-04-14 | Disposition: A | Discharge status: Home / Self Care | Attending: Emergency Medicine | Admitting: Emergency Medicine

## 2024-04-14 DIAGNOSIS — R071 Chest pain on breathing: Secondary | ICD-10-CM | POA: Diagnosis present

## 2024-04-14 DIAGNOSIS — R079 Chest pain, unspecified: Secondary | ICD-10-CM

## 2024-04-14 LAB — COMPREHENSIVE METABOLIC PANEL WITH GFR
ALT: 12 U/L (ref 0–44)
AST: 21 U/L (ref 15–41)
Albumin: 3.9 g/dL (ref 3.5–5.0)
Alkaline Phosphatase: 54 U/L (ref 38–126)
Anion gap: 8 (ref 5–15)
BUN: 7 mg/dL (ref 6–20)
CO2: 24 mmol/L (ref 22–32)
Calcium: 8.7 mg/dL — ABNORMAL LOW (ref 8.9–10.3)
Chloride: 105 mmol/L (ref 98–111)
Creatinine, Ser: 1.1 mg/dL (ref 0.61–1.24)
GFR, Estimated: >60 mL/min (ref >60)
Glucose, Bld: 97 mg/dL (ref 70–99)
Potassium: 4.5 mmol/L (ref 3.5–5.1)
Sodium: 137 mmol/L (ref 135–145)
Total Bilirubin: 1.4 mg/dL — ABNORMAL HIGH (ref 0.0–1.2)
Total Protein: 6.4 g/dL — ABNORMAL LOW (ref 6.5–8.1)

## 2024-04-14 LAB — CBC WITH DIFFERENTIAL/PLATELET
Abs Immature Granulocytes: 0.03 K/uL (ref 0.00–0.07)
Basophils Absolute: 0.1 K/uL (ref 0.0–0.1)
Basophils Relative: 1 %
Eosinophils Absolute: 0.2 K/uL (ref 0.0–0.5)
Eosinophils Relative: 3 %
HCT: 38.9 % — ABNORMAL LOW (ref 39.0–52.0)
Hemoglobin: 13.2 g/dL (ref 13.0–17.0)
Immature Granulocytes: 1 %
Lymphocytes Relative: 25 %
Lymphs Abs: 1.5 K/uL (ref 0.7–4.0)
MCH: 31.1 pg (ref 26.0–34.0)
MCHC: 33.9 g/dL (ref 30.0–36.0)
MCV: 91.7 fL (ref 80.0–100.0)
Monocytes Absolute: 0.5 K/uL (ref 0.1–1.0)
Monocytes Relative: 9 %
Neutro Abs: 3.6 K/uL (ref 1.7–7.7)
Neutrophils Relative %: 61 %
Platelets: 147 K/uL — ABNORMAL LOW (ref 150–400)
RBC: 4.24 MIL/uL (ref 4.22–5.81)
RDW: 13.7 % (ref 11.5–15.5)
WBC: 5.9 K/uL (ref 4.0–10.5)
nRBC: 0 % (ref 0.0–0.2)

## 2024-04-14 LAB — LIPASE, BLOOD: Lipase: 23 U/L (ref 11–51)

## 2024-04-14 LAB — TROPONIN I (HIGH SENSITIVITY)
Troponin I (High Sensitivity): 4 ng/L
Troponin I (High Sensitivity): 3 ng/L (ref <18)

## 2024-04-14 MED ORDER — IOHEXOL 350 MG/ML SOLN
75.0000 mL | Freq: Once | INTRAVENOUS | Status: AC | PRN
  Administered 2024-04-14: 75 mL via INTRAVENOUS

## 2024-04-14 NOTE — ED Triage Notes (Signed)
 Patient presents to the ER with c/o chest pain radiating to his back starting 0530 this morning with a productive cough. Patient was given 324mg  ASA in route. Patient denies cardiac hx.

## 2024-04-14 NOTE — Progress Notes (Signed)
 [facility] [employer] [place]  [address] [place], [place]  [address] [phone]  Subjective This was an emergency call with a disruption in patient care and scheduled clinic appointments.    Douglas Mcconnell is a 38 y.o. male.  WD ID: [id number] Shift: 1st  Full-time GL and #: Douglas Mcconnell  TM ambulated to the [address] stating he woke up this morning with chest pains and wanted to get checked out.  Notes:  TM states waking up with mid sternal chest pains. Non radiating. No meds or allergies noted. TM A&Ox4, breathing normally, speaking in full sentences. TM states the pain is 6/10 but only occurs when he breathes deeply. VSS. TM was released to RTW without any restrictions at this time. TM verbalized understanding to return to medical if his condition worsens.   Triage nurse: Katelyn Whisenant    History Reviewed:       HPI  Review of Systems  No data recorded  Objective There were no vitals filed for this visit. There were no vitals filed for this visit.   Results for orders placed or performed in visit on 11/21/22  Spirometry, Complete CPT 94010   Collection Time: 11/21/22 12:59 PM  Result Value Ref Range   FVC 4.00 liters   FEV1 3.22 liters   FEV1/FVC 0.72 %   FEV1 % predicted (upright) 111.6 80 - 120 %      Physical Exam Assessment:  No diagnosis found.   No orders of the defined types were placed in this encounter.    There are no Patient Instructions on file for this visit.

## 2024-04-14 NOTE — ED Provider Notes (Signed)
 [place] EMERGENCY DEPARTMENT AT [facility] Provider Note   CSN: [id number] Arrival date & time: 04/14/24  1038     Patient presents with: Chest Pain and Cough   Douglas Mcconnell is a 38 y.o. male.   38 yo M with a chief complaint of chest pain.  This started this morning feels like it goes straight from the front into the back. Occurs with breathing.  Nothing else seems to make it worse.    Patient denies history of MI, denies hypertension hyperlipidemia diabetes or smoking.  Denies family history of MI.  Patient denies history of PE or DVT denies hemoptysis denies unilateral lower extremity edema denies recent surgery immobilization hospitalization estrogen use or history of cancer.     Chest Pain Associated symptoms: cough   Cough Associated symptoms: chest pain        Prior to Admission medications   Medication Sig Start Date End Date Taking? Authorizing Provider  cyclobenzaprine  (FLEXERIL ) 10 MG tablet Take 1 tablet (10 mg total) by mouth 2 (two) times daily as needed for muscle spasms. 03/02/13   Kirichenko, Tatyana, PA-C  ibuprofen  (ADVIL ,MOTRIN ) 800 MG tablet Take 1 tablet (800 mg total) by mouth 3 (three) times daily. 03/02/13   Kirichenko, Tatyana, PA-C    Allergies: Patient has no known allergies.    Review of Systems  Respiratory:  Positive for cough.   Cardiovascular:  Positive for chest pain.    Updated Vital Signs BP (!) 139/93   Pulse 60   Temp 97.8 F (36.6 C)   Resp 15   Ht 6' 2 (1.88 m)   Wt 83.5 kg   SpO2 99%   BMI 23.62 kg/m   Physical Exam Vitals and nursing note reviewed.  Constitutional:      Appearance: He is well-developed.  HENT:     Head: Normocephalic and atraumatic.  Eyes:     Pupils: Pupils are equal, round, and reactive to light.  Neck:     Vascular: No JVD.  Cardiovascular:     Rate and Rhythm: Normal rate and regular rhythm.     Heart sounds: No murmur heard.    No friction rub. No gallop.  Pulmonary:      Effort: No respiratory distress.     Breath sounds: No wheezing.  Chest:     Chest wall: No tenderness.  Abdominal:     General: There is no distension.     Tenderness: There is no abdominal tenderness. There is no guarding or rebound.  Musculoskeletal:        General: Normal range of motion.     Cervical back: Normal range of motion and neck supple.  Skin:    Coloration: Skin is not pale.     Findings: No rash.  Neurological:     Mental Status: He is alert and oriented to person, place, and time.  Psychiatric:        Behavior: Behavior normal.     (all labs ordered are listed, but only abnormal results are displayed) Labs Reviewed  CBC WITH DIFFERENTIAL/PLATELET - Abnormal; Notable for the following components:      Result Value   HCT 38.9 (*)    Platelets 147 (*)    All other components within normal limits  COMPREHENSIVE METABOLIC PANEL WITH GFR - Abnormal; Notable for the following components:   Calcium 8.7 (*)    Total Protein 6.4 (*)    Total Bilirubin 1.4 (*)    All other components within  normal limits  LIPASE, BLOOD  TROPONIN I (HIGH SENSITIVITY)  TROPONIN I (HIGH SENSITIVITY)    EKG: EKG Interpretation Date/Time:  Wednesday April 14 2024 10:43:32 EDT Ventricular Rate:  57 PR Interval:  173 QRS Duration:  113 QT Interval:  416 QTC Calculation: 405 R Axis:   69  Text Interpretation: Normal sinus rhythm Benign early repolarization No old tracing to compare Confirmed by Emil Share [id number]) on 04/14/2024 11:16:49 AM  Radiology: CT Angio Chest/Abd/Pel for Dissection W and/or Wo Contrast Result Date: 04/14/2024 CLINICAL DATA:  Acute chest and back pain. EXAM: CT ANGIOGRAPHY CHEST, ABDOMEN AND PELVIS TECHNIQUE: Non-contrast CT of the chest was initially obtained. Multidetector CT imaging through the chest, abdomen and pelvis was performed using the standard protocol during bolus administration of intravenous contrast. Multiplanar reconstructed images and MIPs  were obtained and reviewed to evaluate the vascular anatomy. RADIATION DOSE REDUCTION: This exam was performed according to the departmental dose-optimization program which includes automated exposure control, adjustment of the mA and/or kV according to patient size and/or use of iterative reconstruction technique. CONTRAST:  75mL OMNIPAQUE IOHEXOL 350 MG/ML SOLN COMPARISON:  July 02, 2007. FINDINGS: CTA CHEST FINDINGS Cardiovascular: Preferential opacification of the thoracic aorta. No evidence of thoracic aortic aneurysm or dissection. Normal heart size. No pericardial effusion. Mediastinum/Nodes: No enlarged mediastinal, hilar, or axillary lymph nodes. Thyroid gland, trachea, and esophagus demonstrate no significant findings. Lungs/Pleura: No pneumothorax or pleural effusion is noted. Minimal biapical scarring is noted. Peripheral bulla or blebs are noted in both upper lobes. No acute pulmonary abnormality is noted. Musculoskeletal: No chest wall abnormality. No acute or significant osseous findings. Review of the MIP images confirms the above findings. CTA ABDOMEN AND PELVIS FINDINGS VASCULAR Aorta: Normal caliber aorta without aneurysm, dissection, vasculitis or significant stenosis. Celiac: Patent without evidence of aneurysm, dissection, vasculitis or significant stenosis. SMA: Patent without evidence of aneurysm, dissection, vasculitis or significant stenosis. Renals: Both renal arteries are patent without evidence of aneurysm, dissection, vasculitis, fibromuscular dysplasia or significant stenosis. IMA: Patent without evidence of aneurysm, dissection, vasculitis or significant stenosis. Inflow: Patent without evidence of aneurysm, dissection, vasculitis or significant stenosis. Veins: No obvious venous abnormality within the limitations of this arterial phase study. Review of the MIP images confirms the above findings. NON-VASCULAR Hepatobiliary: No focal liver abnormality is seen. No gallstones,  gallbladder wall thickening, or biliary dilatation. Pancreas: Unremarkable. No pancreatic ductal dilatation or surrounding inflammatory changes. Spleen: Normal in size without focal abnormality. Adrenals/Urinary Tract: Adrenal glands are unremarkable. Kidneys are normal, without renal calculi, focal lesion, or hydronephrosis. Bladder is unremarkable. Stomach/Bowel: The stomach is unremarkable. There is no evidence of bowel obstruction. The appendix is not clearly visualized. Lymphatic: No adenopathy is noted. Reproductive: Prostate is unremarkable. Other: No ascites or hernia is noted. Musculoskeletal: No acute or significant osseous findings. Review of the MIP images confirms the above findings. IMPRESSION: 1. No evidence of thoracic or abdominal aortic dissection or aneurysm. 2. No acute abnormality seen in the chest, abdomen or pelvis. Electronically Signed   By: Lynwood Landy Raddle M.D.   On: 04/14/2024 13:30   DG Chest Port 1 View Result Date: 04/14/2024 CLINICAL DATA:  Chest pain, cough EXAM: PORTABLE CHEST 1 VIEW COMPARISON:  December 25, 2012 chest radiograph and prior studies FINDINGS: The heart size and mediastinal contours are within normal limits. Both lungs are clear. The visualized skeletal structures are unremarkable. IMPRESSION: No active disease. Electronically Signed   By: Michaeline Blanch M.D.   On: 04/14/2024 11:07  Procedures   Medications Ordered in the ED  iohexol (OMNIPAQUE) 350 MG/ML injection 75 mL (75 mLs Intravenous Contrast Given 04/14/24 1250)                                    Medical Decision Making Amount and/or Complexity of Data Reviewed Labs: ordered. Radiology: ordered.  Risk Prescription drug management.   38 yo M with a chief complaint of chest pain.  Chest pain starts in the sternum and goes straight to his back.  Started at about 530 this morning.  Pleuritic.  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  With chest pain and go straight to the back must  consider dissection.  Plain film of the chest without obvious focal infiltrates or pneumothorax.  No anemia, no significant electrolyte abnormalities.  Troponin negative.  Will obtain CT imaging.  CT dissection study negative for dissection.  No large PE, no occult pneumonia. Second troponin is also negative.  I discussed results with patient and family.  Will discharge home.  PCP follow-up  2:06 PM:  I have discussed the diagnosis/risks/treatment options with the patient and family.  Evaluation and diagnostic testing in the emergency department does not suggest an emergent condition requiring admission or immediate intervention beyond what has been performed at this time.  They will follow up with PCP. We also discussed returning to the ED immediately if new or worsening sx occur. We discussed the sx which are most concerning (e.g., sudden worsening pain, fever, inability to tolerate by mouth, exertional symptoms, hemoptysis) that necessitate immediate return. Medications administered to the patient during their visit and any new prescriptions provided to the patient are listed below.  Medications given during this visit Medications  iohexol (OMNIPAQUE) 350 MG/ML injection 75 mL (75 mLs Intravenous Contrast Given 04/14/24 1250)     The patient appears reasonably screen and/or stabilized for discharge and I doubt any other medical condition or other [facility] requiring further screening, evaluation, or treatment in the ED at this time prior to discharge.        Final diagnoses:  Nonspecific chest pain    ED Discharge Orders     None          Emil Share, DO 04/14/24 1406

## 2024-04-14 NOTE — ED Notes (Signed)
 Patient discharged to home with friends. Patient given written and oral discharge instructions. Patient verbalized understanding. Patient ambulatory to exit with steady gait. Patient breathing without difficulty. Patient denies questions, concerns or needs at this time.

## 2024-04-14 NOTE — Discharge Instructions (Signed)
 Follow-up with your family doctor in the office.  Please return for worsening symptoms especially upon exercise if you cough up blood or if you pass out.
